# Patient Record
Sex: Female | Born: 1980
Health system: Southern US, Community
[De-identification: ages and names within clinical notes are randomized; demographics above are authoritative.]

## PROBLEM LIST (undated history)

## (undated) HISTORY — PX: ANKLE SURGERY: SHX546

## (undated) HISTORY — PX: RHINOPLASTY: SUR1284

## (undated) HISTORY — PX: HEMORRHOID SURGERY: SHX153

---

## 2001-12-11 ENCOUNTER — Other Ambulatory Visit: Admission: RE | Admit: 2001-12-11 | Discharge: 2001-12-11 | Payer: Self-pay

## 2002-01-21 DIAGNOSIS — D229 Melanocytic nevi, unspecified: Secondary | ICD-10-CM

## 2002-01-21 HISTORY — DX: Melanocytic nevi, unspecified: D22.9

## 2003-01-27 ENCOUNTER — Ambulatory Visit (HOSPITAL_BASED_OUTPATIENT_CLINIC_OR_DEPARTMENT_OTHER): Admission: RE | Admit: 2003-01-27 | Discharge: 2003-01-27 | Payer: Self-pay | Admitting: Otolaryngology

## 2009-05-05 ENCOUNTER — Encounter: Payer: Self-pay | Admitting: Internal Medicine

## 2009-05-05 ENCOUNTER — Ambulatory Visit: Payer: Self-pay | Admitting: Gastroenterology

## 2009-05-05 DIAGNOSIS — R197 Diarrhea, unspecified: Secondary | ICD-10-CM | POA: Insufficient documentation

## 2009-05-05 DIAGNOSIS — R634 Abnormal weight loss: Secondary | ICD-10-CM

## 2009-05-05 DIAGNOSIS — K219 Gastro-esophageal reflux disease without esophagitis: Secondary | ICD-10-CM

## 2009-05-05 DIAGNOSIS — K921 Melena: Secondary | ICD-10-CM

## 2009-05-07 ENCOUNTER — Encounter: Payer: Self-pay | Admitting: Internal Medicine

## 2009-05-12 HISTORY — PX: COLONOSCOPY: SHX174

## 2009-05-14 ENCOUNTER — Ambulatory Visit: Payer: Self-pay | Admitting: Gastroenterology

## 2009-05-14 ENCOUNTER — Ambulatory Visit (HOSPITAL_COMMUNITY): Admission: RE | Admit: 2009-05-14 | Discharge: 2009-05-14 | Payer: Self-pay | Admitting: Gastroenterology

## 2009-10-14 ENCOUNTER — Encounter: Admission: RE | Admit: 2009-10-14 | Discharge: 2009-10-14 | Payer: Self-pay | Admitting: Internal Medicine

## 2010-04-13 NOTE — Letter (Signed)
Summary: [facility]  OFFICE NOTE/[facility]   Imported By: Diana Eves 05/07/2009 16:07:07  _____________________________________________________________________  External Attachment:    Type:   Image     Comment:   External Document

## 2010-04-13 NOTE — Assessment & Plan Note (Signed)
Summary: NPP.DIARRHEA./GLU   Visit Type:  Consult Referring Provider:  Sherril Croon Primary Care Provider:  Vyas  Chief Complaint:  diarrhea.  History of Present Illness: Ms. Beverly Brown is a pleasant 30 year old lady who presents for further evaluation of chronic diarrhea. She developed diarrhea about 5-6 months ago. She states every time she eats she has loose stool. If she doesn't eat, her stool frequency improves. She denies having solid stool at this point. She's had several episodes of incontinence. She has bright red blood noted on the toilet tissue. She complains of lower abdominal pain. In the last one month she lost 10-15 pounds which is documented. Her appetite was diminished on Levbid so she stopped the medication.  She was given Lomotil but was afraid to take it. She's been off Accutane now for about 6 months. She took it for 6-8 months. As a child she recalls having severe constipation and bleeding. She saw a gastroenterologist but she does not recall the details. She desires to have a colonoscopy because of her symptoms and concern for inflammatory bowel disease. She also complains of short-lived left chest pain which is severe at times. It last seconds to minutes. She thought it might be reflux. It happens about once or twice per week. Denies any associated diaphoresis or shortness of breath.    Current Medications (verified): 1)  None  Allergies (verified): No Known Drug Allergies  Past History:  Past Medical History: Acne, completed Accutane 6 months ago.  Past Surgical History: Rhinoplasty, therapeutic Hemorrhoids  Family History: Father, bowel issues, deceased, does not know details. No FH CRC.  Social History: Married. No children. Full-time student trying to get RN. 1ppd cig for 14 years. No alcohol, drugs.  Review of Systems General:  Complains of weight loss; denies fever, chills, sweats, anorexia, fatigue, and weakness. Eyes:  Denies vision loss. ENT:  Denies  nasal congestion, sore throat, hoarseness, and difficulty swallowing. CV:  Denies chest pains, angina, palpitations, dyspnea on exertion, and peripheral edema. Resp:  Denies dyspnea at rest, dyspnea with exercise, cough, sputum, and wheezing. GI:  See HPI. GU:  Denies urinary burning and blood in urine. MS:  Denies joint pain / LOM. Derm:  Denies rash and itching. Neuro:  Denies weakness, frequent headaches, memory loss, and confusion. Psych:  Denies depression and anxiety. Endo:  Complains of unusual weight change. Heme:  Denies bruising and bleeding. Allergy:  Denies hives and rash.  Vital Signs:  Patient profile:   30 year old female Height:      65 inches Weight:      121 pounds BMI:     20.21 Temp:     84 degrees F oral Pulse rate:   84 / minute BP sitting:   120 / 84  (left arm) Cuff size:   regular  Vitals Entered By: Hendricks Limes LPN (May 05, 2009 8:55 AM)  Physical Exam  General:  Well developed, well nourished, no acute distress. Head:  Normocephalic and atraumatic. Eyes:  Conjunctivae pink, no scleral icterus.  Mouth:  Oropharyngeal mucosa moist, pink.  No lesions, erythema or exudate.    Neck:  Supple; no masses or thyromegaly. Lungs:  Clear throughout to auscultation. Heart:  Regular rate and rhythm; no murmurs, rubs,  or bruits. Abdomen:  Soft. Flat. Positive BS. No HSM or masses. No abd bruit or hernia. Mild RLQ tenderness to deep palpation. No rebound or guarding. Rectal:  deferred until time of colonoscopy.   Extremities:  No clubbing, cyanosis, edema or  deformities noted. Neurologic:  Alert and  oriented x4;  grossly normal neurologically. Skin:  Intact without significant lesions or rashes. Cervical Nodes:  No significant cervical adenopathy. Psych:  Alert and cooperative. Normal mood and affect.  Impression & Recommendations:  Problem # 1:  DIARRHEA, CHRONIC (ICD-787.91)  Chronic diarrhea, brbpr, wt loss and lower abd pain. More RLQ pain on  exam. Previous Accutane use. Need to r/o IBD. Colonoscopy/termianl ileoscopy to be performed in near future.  Risks, alternatives, and benefits including but not limited to the risk of reaction to medication, bleeding, infection, and perforation were addressed.  Patient voiced understanding and provided verbal consent.   Orders: Consultation Level III (59563)  Problem # 2:  GERD (ICD-530.81)  Intermittent sharp chest pain, fleeting. No SOB or diaphoresis. Trial of PPI for four weeks. Samples of Prevacid 24 hr to take one by mouth daily. If symptoms persists, would recommend she f/u with PCP to r/o other possible etiologies.  Orders: Consultation Level III [phone]) I would like to thank Dr. Sherril Croon for allowing [place] to take part in the care of this nice patient.  Appended Document: NPP.DIARRHEA./GLU Labs from 1/11, Dr. Sherril Croon, showed normal LFTS, CBC, TSH.

## 2010-04-13 NOTE — Letter (Signed)
Summary: [facility]  [facility]   Imported By: Diana Eves 05/14/2009 10:51:01  _____________________________________________________________________  External Attachment:    Type:   Image     Comment:   External Document

## 2010-04-13 NOTE — Letter (Signed)
Summary: [facility] ORDER  [facility] ORDER   Imported By: Ave Filter 05/05/2009 09:38:16  _____________________________________________________________________  External Attachment:    Type:   Image     Comment:   External Document

## 2010-07-30 NOTE — Op Note (Signed)
NAMEMAISEE, VOLLMAN NO.:  [id number]   MEDICAL RECORD NO.:  [id number]                   PATIENT TYPE:  AMB   LOCATION:  [facility]                                  FACILITY:  [facility]   PHYSICIAN:  Karol T. Lazarus Salines, M.D.              DATE OF BIRTH:  04-Sep-1980   DATE OF PROCEDURE:  01/27/2003  DATE OF DISCHARGE:                                 OPERATIVE REPORT   PREOPERATIVE DIAGNOSIS:  Hypertrophic inferior turbinates with obstruction.   POSTOPERATIVE DIAGNOSIS:  Hypertrophic inferior turbinates with obstruction.   PROCEDURE PERFORMED:  Bilateral SMR inferior turbinates.   SURGEON:  Gloris Manchester. Lazarus Salines, M.D.   ANESTHESIA:  General orotracheal.   BLOOD LOSS:  Minimal.   COMPLICATIONS:  None.   FINDINGS:  Bulky bony inferior turbinates, bilateral, with inflammation of  the anterior poles in the nasal vestibule.  Mild anterior leftward septal  deviation, status post septoplasty with Dr. __________ of last year.   PROCEDURE:  With the patient in the comfortable supine position, general  orotracheal anesthesia was induced without difficulty.  At an appropriate  level, the patient was placed in a slight sitting position.  A saline-  moistened throat pack was placed.  The inferior turbinates were infiltrated  with 6 mL of 1% Xylocaine with 1:100,000 epinephrine using a 25-gauge spinal  needle.  Finally, 4% cocaine solution, 160 mg total, was applied on 0.5 x 3  inch cottonoids to both sides of the nasal septum for intraoperative  hemostasis.  Several minutes were allowed for all of this to take effect.  A  sterile preparation and draping of the midface was accomplished.   The materials were removed from the nose and throat, to be intact and  correct in number.  The findings were as described above.  Beginning on the  right side, the anterior foot of the inferior turbinate was lysed just  behind the nasal valve.  The medial mucosa was incised in an  anterior  upsloping fashion, and a laterally based flap was developed.  The turbinate  was in-fractured.  Using angled turbinate scissors, the turbinate bone and  lateral mucosa were incised in a posterior downsloping fashion, taking  virtually all of the anterior pole and leaving all of the posterior pole.  Bony spicules were carefully removed to allow the mucosa to approximate.  The bulbous posterior pole and the cut mucosal edges were suction  coagulated.  The laterally based flap was lain down.  The turbinate was out-  fractured.  This side was completed.  The left side was done in an identical  fashion.  Following completion of both resections, a triple thickness  Neosporin-impregnated Telfa pack was placed on each side of the nose for  postoperative hemostasis.  At this point, the procedure was completed.  Hemostasis was observed.  The pharynx was suctioned free, and the throat  pack was removed.  The  patient was returned to anesthesia, awakened,  extubated, and transferred to recovery in stable condition.   COMMENTS:  30 year old white female, status post nasal trauma with a  subsequent septoplasty and persistent complaints of nasal obstruction with  hypertrophic inferior turbinates was the indication for today's procedure.  Anticipate a routine postoperative recovery with attention to ice,  elevation, analgesia, antibiosis.  We will remove the packs in one day and  then institute nasal hygiene measures.  We will limit her activities for ten  days.  She was given a low anticipated risk of postanesthetic or  postsurgical complications, feel an outpatient venue is appropriate.                                               Gloris Manchester. Lazarus Salines, M.D.    KTW/MEDQ  D:  01/27/2003  T:  01/27/2003  Job:  [id number]

## 2011-05-27 ENCOUNTER — Other Ambulatory Visit: Payer: Self-pay | Admitting: Dermatology

## 2011-10-26 IMAGING — CT CT ABD-PELV W/ CM
2 of 4 series · 10 of 36 positions shown, 17 images · IV contrast (READICAT/WATER & [ID] OMNI 300)
Comparison: None.

CLINICAL DATA: Unspecified low abdominal pressure for 1 month.  No
history of malignancy or prior relevant surgery.

CT ABDOMEN AND PELVIS WITH CONTRAST
TECHNIQUE: Multidetector CT imaging of the abdomen and pelvis was
performed following the standard protocol during bolus
administration of intravenous contrast.
Contrast: 100 ml Tmnipaque-J77 intravenously.

[Series 3: routine abdomen · axial · 0.63mm/px · z∈[-355,-35]mm · 9 of 80 slices shown, 15 images]
[im 8/80  soft-tissue]
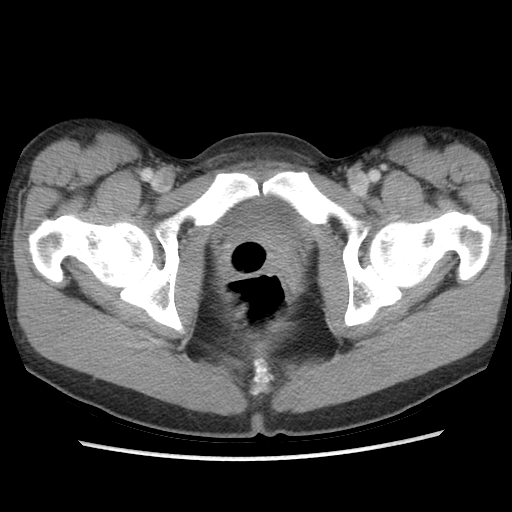
[im 8/80  bone]
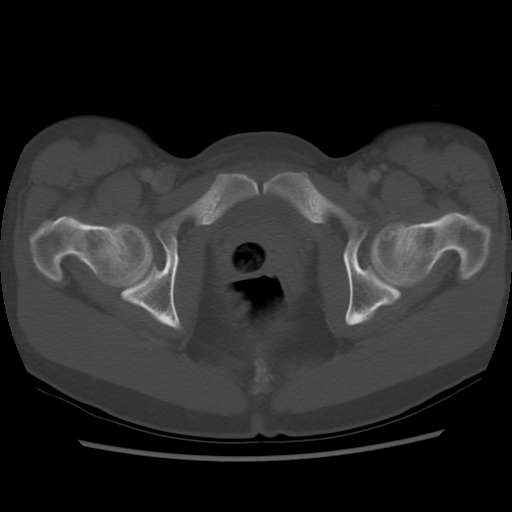
[im 16/80  soft-tissue]
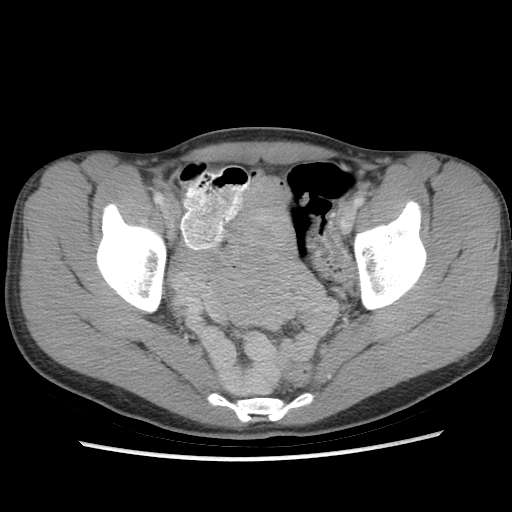
[im 24/80  soft-tissue]
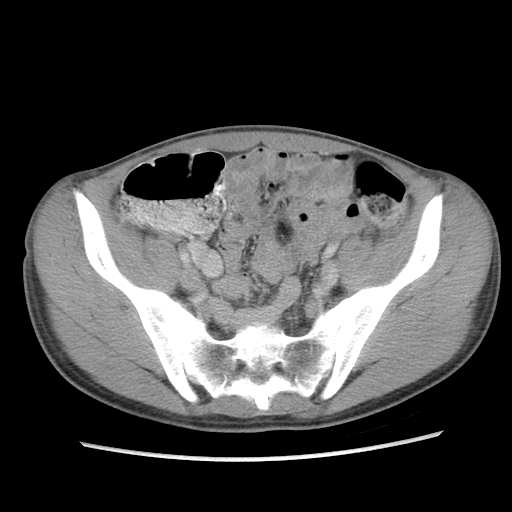
[im 32/80  soft-tissue]
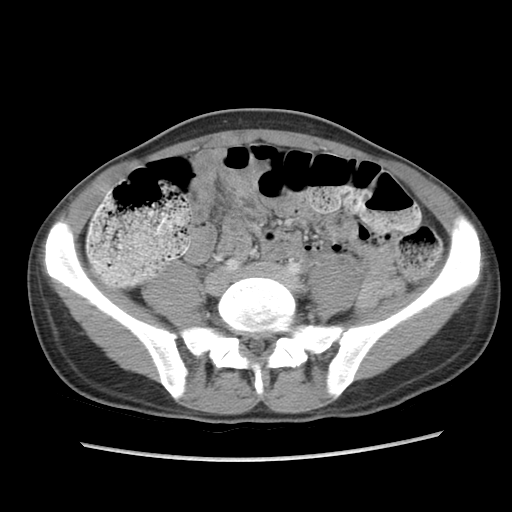
[im 40/80  soft-tissue]
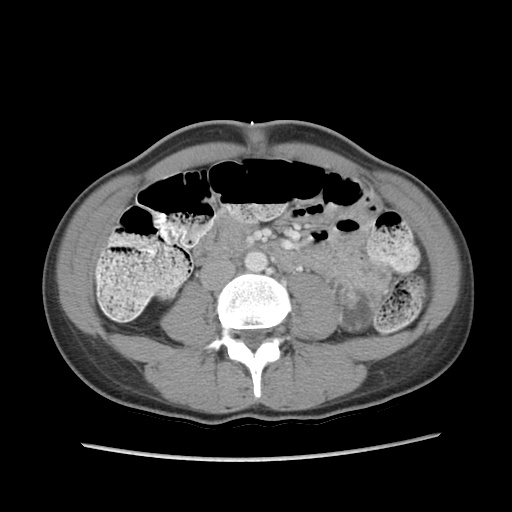
[im 48/80  soft-tissue]
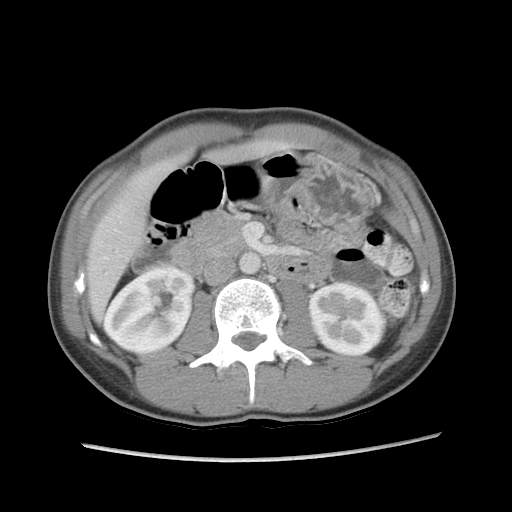
[im 48/80  lung]
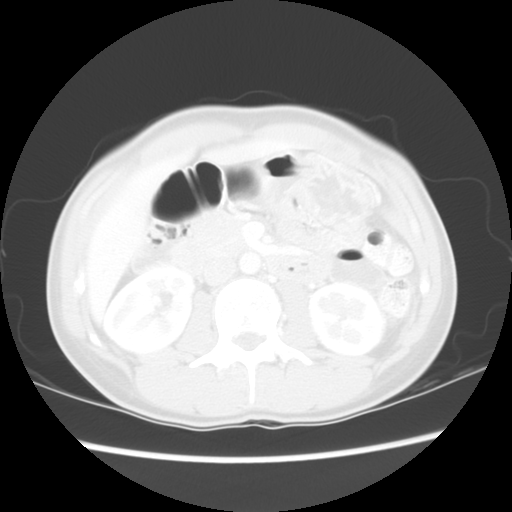
[im 56/80  soft-tissue]
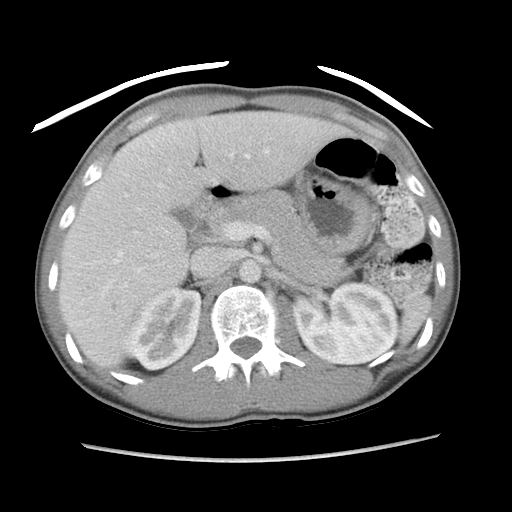
[im 56/80  lung]
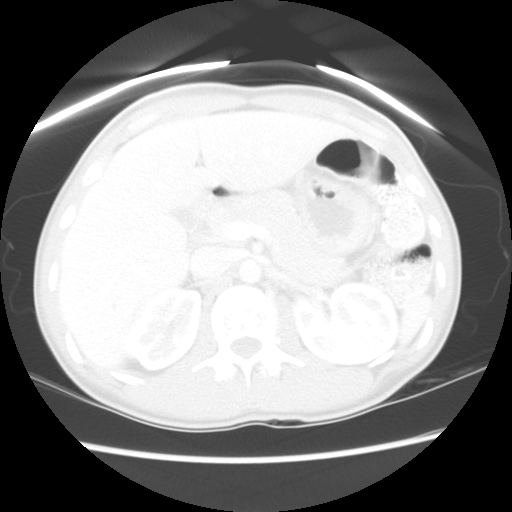
[im 64/80  soft-tissue]
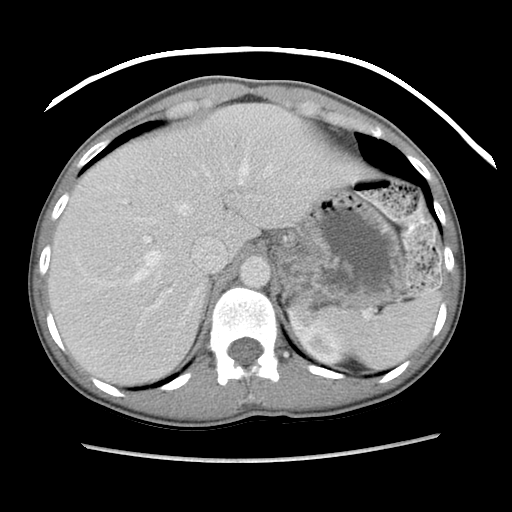
[im 64/80  lung]
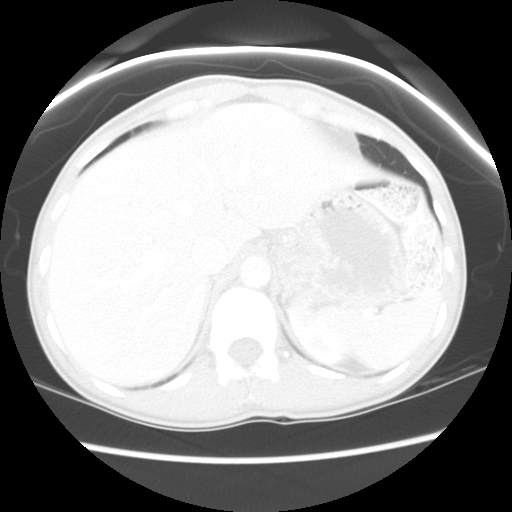
[im 72/80  soft-tissue]
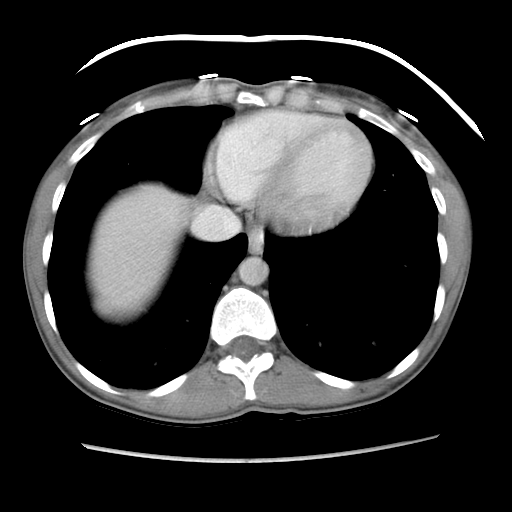
[im 72/80  lung]
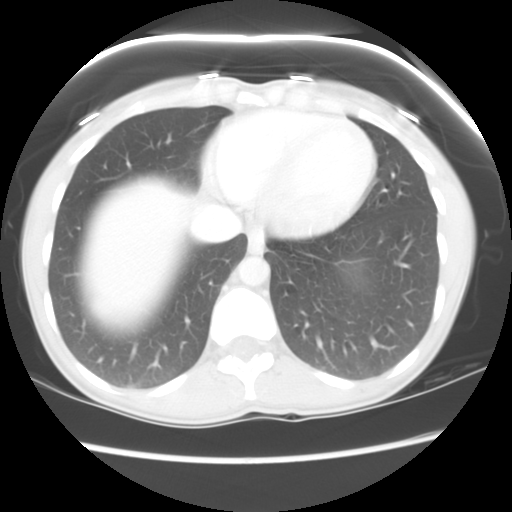
[im 72/80  bone]
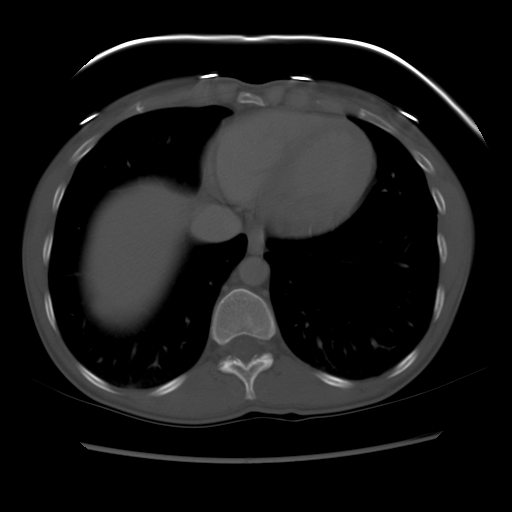

[Series 601: coronal body · coronal · 0.85mm/px · 1 of 105 slices shown, 2 images]
[im 35/105  soft-tissue]
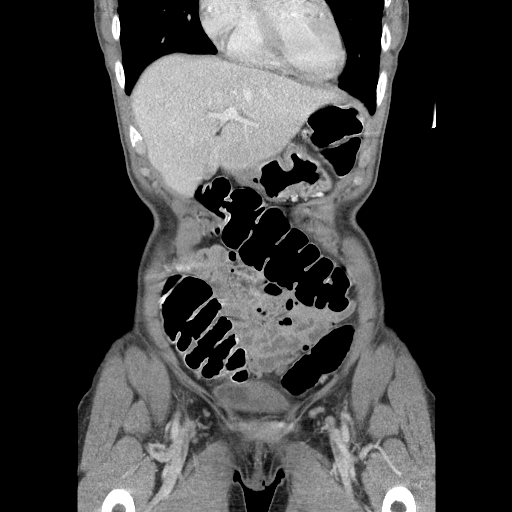
[im 35/105  bone]
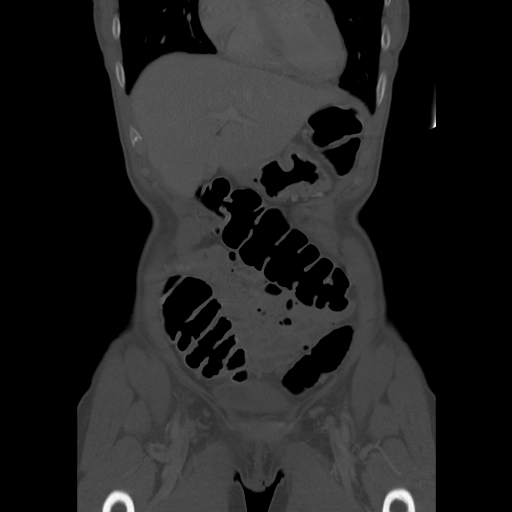

[10 of 36 positions shown; findings below may reference images not displayed]

FINDINGS: The lung bases are clear and there is no pleural
effusion.  Low density in the liver adjacent the falciform ligament
is typical for focal fat.  The liver otherwise appears normal.  The
gallbladder, spleen, pancreas, adrenal glands and kidneys appear
normal.

Moderate stool is noted throughout the colon.  No bowel wall
thickening or inflammatory change is identified.  The appendix
appears normal.  2.1 cm low density structure is noted superior to
the bladder on the left, probably a left ovarian follicle.  No
right adnexal abnormalities identified.  The uterus appears normal.
Vaginal tampon is noted.  There are bilateral pelvic phleboliths.
IMPRESSION: 1.  No demonstrated acute abdominal pelvic findings.
2.  Probable left ovarian follicle.
3.  Prominent stool throughout the colon suggesting constipation.

## 2012-07-17 ENCOUNTER — Ambulatory Visit (HOSPITAL_COMMUNITY)
Admission: RE | Admit: 2012-07-17 | Discharge: 2012-07-17 | Disposition: A | Discharge status: Home / Self Care | Payer: Medicaid Other | Source: Ambulatory Visit | Attending: Specialist | Admitting: Specialist

## 2012-07-17 ENCOUNTER — Encounter (HOSPITAL_COMMUNITY): Payer: Self-pay

## 2012-07-17 VITALS — BP 131/77 | HR 89 | Wt 155.0 lb

## 2012-07-17 DIAGNOSIS — N289 Disorder of kidney and ureter, unspecified: Secondary | ICD-10-CM | POA: Insufficient documentation

## 2012-07-17 DIAGNOSIS — D649 Anemia, unspecified: Secondary | ICD-10-CM

## 2012-07-17 NOTE — Progress Notes (Signed)
MATERNAL FETAL MEDICINE CONSULT  Patient Name: Beverly Brown Medical Record Number:  [id number] Date of Birth: 20-Mar-1980 Requesting Physician Name:  Eliseo Squires, MD Date of Service: 07/17/2012  Chief Complaint Renal insufficiency, proteinuria, and anemia  History of Present Illness Beverly Brown was seen today for prenatal diagnosis secondary to renal insufficiency, proteinuria, and anemia at the request of Eliseo Squires, MD.  The patient is a 32 y.o. [id number],JY [redacted]w[redacted]d with an EDD of 08/11/2012, by Ultrasound dating method.  Her pregnancy was uncomplicated until approximately 30 weeks of gestation when she was noted to have 3+ proteinuria on a routine OB visit.  A 24 hour urine protein collection was performed at 33 weeks and demonstrated 2205 mg of protein.  Her most recent 24 hour urine collection (completed yesterday) showed 4224 mg of protein and a creatinine clearance of 95, with a serum creatinine of 0.95.  She was also noted to have a hemoglobin of 7.7 at approximately 33 weeks.  Yesterday her hemoglobin was 6.8.  She has been compliant with oral iron replacement.  Other than fatigue she reports no symptoms at this time.  Review of Systems Pertinent items are noted in HPI.  Patient History OB History   Grav Para Term Preterm Abortions TAB SAB Ect Mult Living   4 1 1  0 2 0 2 0 0 1     # Outc Date GA Lbr Len/2nd Wgt Sex Del Anes PTL Lv   1 TRM            2 SAB            3 SAB            4 CUR               History reviewed. No pertinent past medical history.  History reviewed. No pertinent past surgical history.  History   Social History  . Marital Status: Married    Spouse Name: N/A    Number of Children: N/A  . Years of Education: N/A   Social History Main Topics  . Smoking status: None  . Smokeless tobacco: None  . Alcohol Use: None  . Drug Use: None  . Sexually Active: None   Other Topics Concern  . None   Social History Narrative  . None    No family  history on file. In addition, the patient has no family history of mental retardation, birth defects, or genetic diseases.  Physical Examination Filed Vitals:   07/17/12 0912  BP: 131/77  Pulse: 89   General appearance - alert, well appearing, and in no distress  Assessment and Recommendations 1.  Renal insufficiency with proteinuria.  Beverly Brown has been normotensive throughout pregnancy save for two isolated blood pressures with systolic pressures of greater than 140 on two prenatal visits.  They have been in the normal range for many weeks now.  Given that she is normotensive and has severe anemia, I suspect a primary renal cause rather than a hypertensive disorder of pregnancy.  She will require a complete work-up, including assessment of glomerular disease and systemic lupus nephritis.  However, as she is already 36 weeks, and she is stable from a renal filtration standpoint, the workup can be deferred until 6 weeks postpartum.  This evaluation would best be performed by a nehprologist.  However we are happy to assist if desired.  I recommend an induction of labor at 37 weeks given the increase risk of stillbirth in women with  renal disease.  Beverly Brown reports she had a reassuring NST earlier this week and has another one schedule for later this week.  Twice weekly fetal testing should be continued if the decision is made to prolong pregnancy past 37 weeks. 2.  Severe Anemia.  Beverly Brown most recent hemoglobin was very low at 6.8.  Given this low starting point she is at risk of life threatening anemia with even a usual amount of postpartum blood loss.  This needs to be addressed prior to delivery.  Unfortunately, oral iron replacement has not been effective in treating the anemia.  IV iron would be a reasonable option; however, one week of treatment is unlikely to result in a sufficient increase in hemoglobin.  If a sufficient rise is not seen I recommend proceeding with a red blood cell  transfusion prior to induction.  Beverly Fendt, MD

## 2012-07-17 NOTE — Addendum Note (Signed)
Encounter addended by: Rema Fendt, MD on: 07/17/2012 10:42 AM<BR>     Documentation filed: Notes Section

## 2012-07-17 NOTE — Progress Notes (Signed)
.  [email or web address]

## 2012-07-17 NOTE — Progress Notes (Signed)
I spent 30 minutes with Beverly Brown today of which 50% was face-to-face counseling.

## 2012-07-17 NOTE — Addendum Note (Signed)
Encounter addended by: Rema Fendt, MD on: 07/17/2012 10:43 AM<BR>     Documentation filed: Notes Section

## 2012-07-18 ENCOUNTER — Other Ambulatory Visit: Payer: Self-pay

## 2013-05-16 ENCOUNTER — Other Ambulatory Visit: Payer: Self-pay | Admitting: Dermatology

## 2013-05-22 ENCOUNTER — Encounter (HOSPITAL_COMMUNITY): Payer: Self-pay | Admitting: *Deleted

## 2014-01-13 ENCOUNTER — Encounter (HOSPITAL_COMMUNITY): Payer: Self-pay | Admitting: *Deleted

## 2014-07-08 ENCOUNTER — Encounter: Payer: Self-pay | Admitting: Gastroenterology

## 2014-07-30 ENCOUNTER — Encounter: Payer: Self-pay | Admitting: Gastroenterology

## 2014-07-30 ENCOUNTER — Ambulatory Visit: Payer: Medicaid Other | Admitting: Gastroenterology

## 2014-07-30 ENCOUNTER — Telehealth: Payer: Self-pay | Admitting: Gastroenterology

## 2014-07-30 NOTE — Telephone Encounter (Signed)
PATIENT WAS A NO SHOW   SHE REASCHEULED

## 2014-08-04 ENCOUNTER — Encounter: Payer: Self-pay | Admitting: Gastroenterology

## 2014-08-04 ENCOUNTER — Ambulatory Visit (INDEPENDENT_AMBULATORY_CARE_PROVIDER_SITE_OTHER): Payer: BLUE CROSS/BLUE SHIELD | Admitting: Gastroenterology

## 2014-08-04 ENCOUNTER — Encounter (INDEPENDENT_AMBULATORY_CARE_PROVIDER_SITE_OTHER): Payer: Self-pay

## 2014-08-04 VITALS — BP 132/69 | HR 95 | Temp 98.3°F | Ht 65.0 in | Wt 124.8 lb

## 2014-08-04 DIAGNOSIS — R1013 Epigastric pain: Secondary | ICD-10-CM | POA: Diagnosis not present

## 2014-08-04 DIAGNOSIS — R197 Diarrhea, unspecified: Secondary | ICD-10-CM

## 2014-08-04 DIAGNOSIS — D509 Iron deficiency anemia, unspecified: Secondary | ICD-10-CM

## 2014-08-04 DIAGNOSIS — R1030 Lower abdominal pain, unspecified: Secondary | ICD-10-CM | POA: Diagnosis not present

## 2014-08-04 NOTE — Patient Instructions (Signed)
1. I have requested records from Dr. Britta Mccreedy and [facility] health for further review. Once reviewed, we will make further recommendations.

## 2014-08-04 NOTE — Assessment & Plan Note (Signed)
Ongoing poor appetite, postprandial epigastric pain. Denies any typical heartburn. Suspect nonulcerative dyspepsia although cannot rule out peptic ulcer disease. Awaiting records from Dr. Britta Mccreedy. Further recommendations to follow.
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
## 2014-08-04 NOTE — Assessment & Plan Note (Signed)
Patient gives history of iron deficiency anemia dating back about 4 years. She has had 2 pregnancies since that time. No menstrual losses since December 2015. Chronic iron therapy since 2012. Requested records from women's health regarding last labs within the past one year. Current labs indicate low iron saturations but ferritin and hemoglobin are normal. Further recommendations to follow.
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
## 2014-08-05 NOTE — Progress Notes (Signed)
cc'ed to pcp °

## 2014-10-09 ENCOUNTER — Telehealth: Payer: Self-pay | Admitting: Gastroenterology

## 2014-10-09 DIAGNOSIS — R197 Diarrhea, unspecified: Secondary | ICD-10-CM

## 2014-10-09 NOTE — Telephone Encounter (Signed)
Tried to call with no answer  

## 2014-10-09 NOTE — Telephone Encounter (Signed)
Please let patient know that we never received any significant records from Dr. Britta Mccreedy or her GYN.  Offer patient to have the following labs -TTG, IgA -IgA level  Offer patient follow up OV.

## 2014-10-13 ENCOUNTER — Other Ambulatory Visit: Payer: Self-pay

## 2014-10-13 DIAGNOSIS — R197 Diarrhea, unspecified: Secondary | ICD-10-CM

## 2014-10-13 NOTE — Telephone Encounter (Signed)
Mailed letter and labs to pt.  This is an [facility] pt.

## 2016-08-02 DIAGNOSIS — Z0000 Encounter for general adult medical examination without abnormal findings: Secondary | ICD-10-CM | POA: Diagnosis not present

## 2016-08-02 DIAGNOSIS — Z1389 Encounter for screening for other disorder: Secondary | ICD-10-CM | POA: Diagnosis not present

## 2016-08-02 DIAGNOSIS — Z720 Tobacco use: Secondary | ICD-10-CM | POA: Diagnosis not present

## 2016-08-02 DIAGNOSIS — L209 Atopic dermatitis, unspecified: Secondary | ICD-10-CM | POA: Diagnosis not present

## 2016-08-02 DIAGNOSIS — Z6822 Body mass index (BMI) 22.0-22.9, adult: Secondary | ICD-10-CM | POA: Diagnosis not present

## 2016-11-23 DIAGNOSIS — Z6821 Body mass index (BMI) 21.0-21.9, adult: Secondary | ICD-10-CM | POA: Diagnosis not present

## 2016-11-23 DIAGNOSIS — N898 Other specified noninflammatory disorders of vagina: Secondary | ICD-10-CM | POA: Diagnosis not present

## 2016-11-23 DIAGNOSIS — Z01411 Encounter for gynecological examination (general) (routine) with abnormal findings: Secondary | ICD-10-CM | POA: Diagnosis not present

## 2016-11-23 DIAGNOSIS — B001 Herpesviral vesicular dermatitis: Secondary | ICD-10-CM | POA: Diagnosis not present

## 2016-12-02 DIAGNOSIS — Z6821 Body mass index (BMI) 21.0-21.9, adult: Secondary | ICD-10-CM | POA: Diagnosis not present

## 2016-12-02 DIAGNOSIS — J0101 Acute recurrent maxillary sinusitis: Secondary | ICD-10-CM | POA: Diagnosis not present

## 2016-12-02 DIAGNOSIS — R350 Frequency of micturition: Secondary | ICD-10-CM | POA: Diagnosis not present

## 2016-12-13 DIAGNOSIS — X501XXA Overexertion from prolonged static or awkward postures, initial encounter: Secondary | ICD-10-CM | POA: Diagnosis not present

## 2016-12-13 DIAGNOSIS — Y9351 Activity, roller skating (inline) and skateboarding: Secondary | ICD-10-CM | POA: Diagnosis not present

## 2016-12-13 DIAGNOSIS — F17200 Nicotine dependence, unspecified, uncomplicated: Secondary | ICD-10-CM | POA: Diagnosis not present

## 2016-12-13 DIAGNOSIS — M25571 Pain in right ankle and joints of right foot: Secondary | ICD-10-CM | POA: Diagnosis not present

## 2016-12-13 DIAGNOSIS — S82891A Other fracture of right lower leg, initial encounter for closed fracture: Secondary | ICD-10-CM | POA: Diagnosis not present

## 2016-12-13 DIAGNOSIS — S8261XA Displaced fracture of lateral malleolus of right fibula, initial encounter for closed fracture: Secondary | ICD-10-CM | POA: Diagnosis not present

## 2016-12-13 DIAGNOSIS — Y92331 Roller skating rink as the place of occurrence of the external cause: Secondary | ICD-10-CM | POA: Diagnosis not present

## 2016-12-15 DIAGNOSIS — Z6821 Body mass index (BMI) 21.0-21.9, adult: Secondary | ICD-10-CM | POA: Diagnosis not present

## 2016-12-15 DIAGNOSIS — S82851A Displaced trimalleolar fracture of right lower leg, initial encounter for closed fracture: Secondary | ICD-10-CM | POA: Diagnosis not present

## 2016-12-19 DIAGNOSIS — Y92838 Other recreation area as the place of occurrence of the external cause: Secondary | ICD-10-CM | POA: Diagnosis not present

## 2016-12-19 DIAGNOSIS — S82851A Displaced trimalleolar fracture of right lower leg, initial encounter for closed fracture: Secondary | ICD-10-CM | POA: Diagnosis not present

## 2016-12-19 DIAGNOSIS — Y9351 Activity, roller skating (inline) and skateboarding: Secondary | ICD-10-CM | POA: Diagnosis not present

## 2016-12-19 DIAGNOSIS — F17210 Nicotine dependence, cigarettes, uncomplicated: Secondary | ICD-10-CM | POA: Diagnosis not present

## 2016-12-19 DIAGNOSIS — W1839XA Other fall on same level, initial encounter: Secondary | ICD-10-CM | POA: Diagnosis not present

## 2016-12-20 DIAGNOSIS — Y9351 Activity, roller skating (inline) and skateboarding: Secondary | ICD-10-CM | POA: Diagnosis not present

## 2016-12-20 DIAGNOSIS — W1839XA Other fall on same level, initial encounter: Secondary | ICD-10-CM | POA: Diagnosis not present

## 2016-12-20 DIAGNOSIS — S82832A Other fracture of upper and lower end of left fibula, initial encounter for closed fracture: Secondary | ICD-10-CM | POA: Diagnosis not present

## 2016-12-20 DIAGNOSIS — Y92838 Other recreation area as the place of occurrence of the external cause: Secondary | ICD-10-CM | POA: Diagnosis not present

## 2016-12-20 DIAGNOSIS — S82851A Displaced trimalleolar fracture of right lower leg, initial encounter for closed fracture: Secondary | ICD-10-CM | POA: Diagnosis not present

## 2016-12-20 DIAGNOSIS — F17210 Nicotine dependence, cigarettes, uncomplicated: Secondary | ICD-10-CM | POA: Diagnosis not present

## 2017-01-03 DIAGNOSIS — S82851D Displaced trimalleolar fracture of right lower leg, subsequent encounter for closed fracture with routine healing: Secondary | ICD-10-CM | POA: Diagnosis not present

## 2017-01-03 DIAGNOSIS — S82851A Displaced trimalleolar fracture of right lower leg, initial encounter for closed fracture: Secondary | ICD-10-CM | POA: Diagnosis not present

## 2017-02-01 DIAGNOSIS — S82851D Displaced trimalleolar fracture of right lower leg, subsequent encounter for closed fracture with routine healing: Secondary | ICD-10-CM | POA: Diagnosis not present

## 2017-03-30 DIAGNOSIS — Z6821 Body mass index (BMI) 21.0-21.9, adult: Secondary | ICD-10-CM | POA: Diagnosis not present

## 2017-03-30 DIAGNOSIS — S82851D Displaced trimalleolar fracture of right lower leg, subsequent encounter for closed fracture with routine healing: Secondary | ICD-10-CM | POA: Diagnosis not present

## 2017-05-16 DIAGNOSIS — R05 Cough: Secondary | ICD-10-CM | POA: Diagnosis not present

## 2017-05-16 DIAGNOSIS — R634 Abnormal weight loss: Secondary | ICD-10-CM | POA: Diagnosis not present

## 2017-05-16 DIAGNOSIS — J301 Allergic rhinitis due to pollen: Secondary | ICD-10-CM | POA: Diagnosis not present

## 2017-05-16 DIAGNOSIS — R5383 Other fatigue: Secondary | ICD-10-CM | POA: Diagnosis not present

## 2017-05-16 DIAGNOSIS — Z6820 Body mass index (BMI) 20.0-20.9, adult: Secondary | ICD-10-CM | POA: Diagnosis not present

## 2017-05-19 DIAGNOSIS — D72829 Elevated white blood cell count, unspecified: Secondary | ICD-10-CM | POA: Diagnosis not present

## 2017-05-19 DIAGNOSIS — D696 Thrombocytopenia, unspecified: Secondary | ICD-10-CM | POA: Diagnosis not present

## 2017-05-30 DIAGNOSIS — N898 Other specified noninflammatory disorders of vagina: Secondary | ICD-10-CM | POA: Diagnosis not present

## 2017-05-30 DIAGNOSIS — R350 Frequency of micturition: Secondary | ICD-10-CM | POA: Diagnosis not present

## 2017-05-30 DIAGNOSIS — R102 Pelvic and perineal pain: Secondary | ICD-10-CM | POA: Diagnosis not present

## 2017-05-30 DIAGNOSIS — Z113 Encounter for screening for infections with a predominantly sexual mode of transmission: Secondary | ICD-10-CM | POA: Diagnosis not present

## 2017-06-14 DIAGNOSIS — R634 Abnormal weight loss: Secondary | ICD-10-CM | POA: Diagnosis not present

## 2017-06-14 DIAGNOSIS — J301 Allergic rhinitis due to pollen: Secondary | ICD-10-CM | POA: Diagnosis not present

## 2017-06-14 DIAGNOSIS — R05 Cough: Secondary | ICD-10-CM | POA: Diagnosis not present

## 2017-06-14 DIAGNOSIS — D72829 Elevated white blood cell count, unspecified: Secondary | ICD-10-CM | POA: Diagnosis not present

## 2017-06-14 DIAGNOSIS — R5383 Other fatigue: Secondary | ICD-10-CM | POA: Diagnosis not present

## 2017-06-29 ENCOUNTER — Encounter: Payer: Self-pay | Admitting: Allergy & Immunology

## 2017-06-29 ENCOUNTER — Ambulatory Visit: Payer: BLUE CROSS/BLUE SHIELD | Admitting: Allergy & Immunology

## 2017-06-29 VITALS — BP 120/66 | HR 108 | Temp 98.5°F | Resp 16 | Ht 63.0 in | Wt 125.6 lb

## 2017-06-29 DIAGNOSIS — T7800XD Anaphylactic reaction due to unspecified food, subsequent encounter: Secondary | ICD-10-CM | POA: Diagnosis not present

## 2017-06-29 DIAGNOSIS — J310 Chronic rhinitis: Secondary | ICD-10-CM

## 2017-06-29 DIAGNOSIS — R05 Cough: Secondary | ICD-10-CM | POA: Diagnosis not present

## 2017-06-29 DIAGNOSIS — R059 Cough, unspecified: Secondary | ICD-10-CM

## 2017-06-29 MED ORDER — EPINEPHRINE 0.3 MG/0.3ML IJ SOAJ: INTRAMUSCULAR | 1 refills | Status: DC

## 2017-06-29 MED ORDER — OMEPRAZOLE 20 MG PO CPDR: DELAYED_RELEASE_CAPSULE | ORAL | 1 refills | Status: DC

## 2017-06-29 MED ORDER — BEPOTASTINE BESILATE 1.5 % OP SOLN: 1.0000 [drp] | Freq: Two times a day (BID) | OPHTHALMIC | 1 refills | Status: DC

## 2017-06-29 MED ORDER — CRISABOROLE 2 % EX OINT: 1.0000 "application " | TOPICAL_OINTMENT | Freq: Two times a day (BID) | CUTANEOUS | 1 refills | Status: DC

## 2017-06-29 NOTE — Patient Instructions (Addendum)
1. Cough - Lung testing looked great today. - We will address postnasal drip before trying to treat any asthma.  - We will start omeprazole 20mg  once daily to see if this might help your cough.  - Reflux is a known cause of cough, and with your chest pain this is something to consider.   2. Chronic rhinitis - Testing was negative to prick and intradermals. - We will get environmental allergy testing via blood work to confirm this. - Start Nasacort two sprays per nostril daily. - Start Bepreve one drop per eye twice daily. - Start Eucrisa twice daily as needed to the eye lid and around your eye (safe to use on face)  3. Anaphylactic shock due to food - Testing was negative to Soy, Wheat, Milk and Casein - Avoid the above foods for now.  - We will get blood work to confirm these findings.  - Training for epinephrine auto-injectors provided: AuviQ - Indications for epinephrine use discussed. - We will call you in 1-2 weeks with the results of the testing.  3. Return in about 1 month (around 07/27/2017).   Please inform us of any Emergency Department visits, hospitalizations, or changes in symptoms. Call us before going to the ED for breathing or allergy symptoms since we might be able to fit you in for a sick visit. Feel free to contact us anytime with any questions, problems, or concerns.  It was a pleasure to meet you and your family today!  Websites that have reliable patient information: 1. American Academy of Asthma, Allergy, and Immunology: www.aaaai.org 2. Food Allergy Research and Education (FARE): foodallergy.org 3. Mothers of Asthmatics: http://www.asthmacommunitynetwork.org 4. American College of Allergy, Asthma, and Immunology: www.acaai.org

## 2017-06-29 NOTE — Progress Notes (Signed)
NEW PATIENT  Date of Service/Encounter:  06/29/17  Referring provider: Denny Levy, PA   Assessment:   Cough - possible GERD  Non-allergic rhinitis  Anaphylactic shock due to food (wheat, cow's milk) - with negative testing today  Plan/Recommendations:   1. Cough - Lung testing looked great today. - We will address postnasal drip before trying to treat any asthma.  - We will start omeprazole 20mg  once daily to see if this might help your cough.  - Reflux is a known cause of cough, and with your chest pain this is something to consider.   2. Chronic rhinitis - Testing was negative to prick and intradermals. - We will get environmental allergy testing via blood work to confirm this, but the negative prick and intradermal testing definitely point away from an allergic etiology. - Her rhinitis symptoms at least might be related to her cigarette smoking, which does damage cilia and can lead to mucous build up.  - Start Nasacort two sprays per nostril daily. - Start Bepreve one drop per eye twice daily. - Start Eucrisa twice daily as needed to the eye lid and around your eye (safe to use on face)  3. Anaphylactic shock due to food - Testing was negative to Soy, Wheat, Milk and Casein - Avoid the above foods for now.  - We will get blood work to confirm these findings.  - Training for epinephrine auto-injectors provided: AuviQ - Indications for epinephrine use discussed. - We will call you in 1-2 weeks with the results of the testing.  3. Return in about 1 month (around 07/27/2017).  Subjective:   JORDAIN RADIN is a 37 y.o. female presenting today for evaluation of  Chief Complaint  Patient presents with  . Cough    since Winter 2018  . Facial Swelling  . Headache  . Rash    STEPHANE JUNKINS has a history of the following: Patient Active Problem List   Diagnosis Date Noted  . Non-allergic rhinitis 06/29/2017  . Abdominal pain, epigastric 08/04/2014  .  Lower abdominal pain 08/04/2014  . IDA (iron deficiency anemia) 08/04/2014  . Renal insufficiency complicating pregnancy 16/96/7893  . Anemia, unspecified 07/17/2012  . GERD 05/05/2009  . HEMATOCHEZIA 05/05/2009  . WEIGHT LOSS, ABNORMAL 05/05/2009  . DIARRHEA, CHRONIC 05/05/2009    History obtained from: chart review and patient.  Ardine Bjork was referred by Denny Levy, PA.     Addilyne is a 37 y.o. female presenting for eye swelling and a cough. Symptoms started in September and have continued since that time. She has treated with Benadryl which did help somewhat. Her eye swelling is uncomfortable and starting to mess up her vision. Eyes are itching. She has not been on steroids for this. She was using an OTC eye drops (artifical tears) that did not help. This was not a problem prior to September 2018.  Charl does report a cough that has worsened. In the mornings, she reports a lot of phelgm. Typically she cannot talk without finishing a sentence at all. This started in September as well. She felt that it was an illness at first but symptoms continued. She was not treated with an inhaler. Symptoms are worse in the morning and then throughout the day she will have problems with intermittently worsening symptoms. She does have a long 20 year history of smoking and reports that she is trying to quit.   Chrysta denies heart burn and she has never been on a reflux  medication. Symptoms do not change with meals at all. She does report chest pain around twice per week. She did take a nitroglycerin once (this was her husband's prescription); this did help her symptoms. She only did this once. She has never been to the ED for this at all. Episodes do not last too long (around 5-10 minutes). She did have a CXR performed two weeks ago and everything was normal per the patient. We do not have access to that CXR in Epic.   Arraya does report a history of throat swelling to cow's milk  containing products as well as wheat. She does not have an EpiPen in place at this time. She treats this with time only, and it resolves over the course of 30-60 minutes.   Otherwise, there is no history of other atopic diseases, including drug allergies, stinging insect allergies, or urticaria. There is no significant infectious history. Vaccinations are up to date.    Past Medical History: Patient Active Problem List   Diagnosis Date Noted  . Non-allergic rhinitis 06/29/2017  . Abdominal pain, epigastric 08/04/2014  . Lower abdominal pain 08/04/2014  . IDA (iron deficiency anemia) 08/04/2014  . Renal insufficiency complicating pregnancy 17/61/6073  . Anemia, unspecified 07/17/2012  . GERD 05/05/2009  . HEMATOCHEZIA 05/05/2009  . WEIGHT LOSS, ABNORMAL 05/05/2009  . DIARRHEA, CHRONIC 05/05/2009    Medication List:  Allergies as of 06/29/2017   No Known Allergies     Medication List        Accurate as of 06/29/17  6:07 PM. Always use your most recent med list.          Bepotastine Besilate 1.5 % Soln Commonly known as:  BEPREVE Place 1 drop into both eyes 2 (two) times daily.   Crisaborole 2 % Oint Commonly known as:  EUCRISA Apply 1 application topically 2 (two) times daily.   EPINEPHrine 0.3 mg/0.3 mL Soaj injection Commonly known as:  AUVI-Q Use for severe allergic reaction   MIRENA (52 MG) 20 MCG/24HR IUD Generic drug:  levonorgestrel by Intrauterine route.   omeprazole 20 MG capsule Commonly known as:  PRILOSEC Take one tablet daily   valACYclovir 500 MG tablet Commonly known as:  VALTREX       Birth History: non-contributory.   Developmental History: non-contributory.   Past Surgical History: Past Surgical History:  Procedure Laterality Date  . ANKLE SURGERY    . COLONOSCOPY  05/2009   Dr. Oneida Alar: normal TI 10cm, moderate internal hemorrhoids, random colon bx ?early lymphocytic colitis.  Marland Kitchen HEMORRHOID SURGERY    . RHINOPLASTY       Family  History: Family History  Problem Relation Age of Onset  . Other Father        bowel issues, details unavailable, deceased  . Allergic rhinitis Father   . Allergic rhinitis Mother   . Colon cancer Neg Hx   . Eczema Neg Hx   . Asthma Neg Hx   . Angioedema Neg Hx   . Urticaria Neg Hx      Social History: Acquanetta lives at home with her family. She lives in a house that was built in the 1950s. There is carpeting throughout the home. Per the note included with her referral, there is a problem with mold in the home. They have electric heating and central cooling. There is a dog inside of the home. There are no dust mite coverings on the bedding. There is no tobacco exposure in the home. She does smoke one  pack per day for 20 years.     Review of Systems: a 14-point review of systems is pertinent for what is mentioned in HPI.  Otherwise, all other systems were negative. Constitutional: negative other than that listed in the HPI Eyes: negative other than that listed in the HPI Ears, nose, mouth, throat, and face: negative other than that listed in the HPI Respiratory: negative other than that listed in the HPI Cardiovascular: negative other than that listed in the HPI Gastrointestinal: negative other than that listed in the HPI Genitourinary: negative other than that listed in the HPI Integument: negative other than that listed in the HPI Hematologic: negative other than that listed in the HPI Musculoskeletal: negative other than that listed in the HPI Neurological: negative other than that listed in the HPI Allergy/Immunologic: negative other than that listed in the HPI    Objective:   Blood pressure 120/66, pulse (!) 108, temperature 98.5 F (36.9 C), temperature source Oral, resp. rate 16, height 5\' 3"  (1.6 m), weight 125 lb 9.6 oz (57 kg), unknown if currently breastfeeding. Body mass index is 22.25 kg/m.   Physical Exam:  General: Alert, interactive, in no acute distress.  Pleasant.  Eyes: Periorbital edema bilaterally with eczematous dry skin and excoriations noted, No conjunctival injection bilaterally, no discharge on the right, no discharge on the left, no Horner-Trantas dots present and allergic shiners present bilaterally. PERRL bilaterally. EOMI without pain. No photophobia.  Ears: Right TM pearly gray with normal light reflex, Left TM pearly gray with normal light reflex, Right TM intact without perforation and Left TM intact without perforation.  Nose/Throat: External nose within normal limits, nasal crease present and septum midline. Turbinates markedly edematous and pale with clear discharge. Posterior oropharynx erythematous with cobblestoning in the posterior oropharynx. Tonsils 3+ without exudates.  Tongue without thrush. Neck: Supple without thyromegaly. Trachea midline. Adenopathy: no enlarged lymph nodes appreciated in the anterior cervical, occipital, axillary, epitrochlear, inguinal, or popliteal regions. Lungs: Clear to auscultation without wheezing, rhonchi or rales. No increased work of breathing. CV: Normal S1/S2. No murmurs. Capillary refill <2 seconds.  Abdomen: Nondistended, nontender. No guarding or rebound tenderness. Bowel sounds present in all fields and hyperactive  Skin: Warm and dry, without lesions or rashes. Extremities:  No clubbing, cyanosis or edema. Neuro:   Grossly intact. No focal deficits appreciated. Responsive to questions.  Diagnostic studies:   Spirometry: results normal (FEV1: 3.01/100%, FVC: 3.65/101%, FEV1/FVC: 82%).    Spirometry consistent with normal pattern.   Allergy Studies:   Indoor/Outdoor Percutaneous Adult Environmental Panel: negative to the entire panel with adequate controls.  Indoor/Outdoor Percutaneous Pediatric Environmental Panel: negative to the entire panel with adequate controls.  Selected Foods: negative to cow's milk, soy, casein, and wheat with adequate controls   Allergy testing  results were read and interpreted by myself, documented by clinical staff.     Salvatore Marvel, MD Allergy and [place] [facility] [place]

## 2017-07-04 DIAGNOSIS — J301 Allergic rhinitis due to pollen: Secondary | ICD-10-CM | POA: Diagnosis not present

## 2017-07-04 DIAGNOSIS — Z720 Tobacco use: Secondary | ICD-10-CM | POA: Diagnosis not present

## 2017-07-04 DIAGNOSIS — J019 Acute sinusitis, unspecified: Secondary | ICD-10-CM | POA: Diagnosis not present

## 2017-07-04 DIAGNOSIS — R05 Cough: Secondary | ICD-10-CM | POA: Diagnosis not present

## 2017-07-06 ENCOUNTER — Telehealth: Payer: Self-pay

## 2017-07-06 LAB — IGE+ALLERGENS ZONE 2(30)
Alternaria Alternata IgE: <0.1 kU/L
Bahia Grass IgE: <0.1 kU/L
Cat Dander IgE: <0.1 kU/L
Cedar, Mountain IgE: <0.1 kU/L
Cockroach, American IgE: <0.1 kU/L
Common Silver Birch IgE: <0.1 kU/L
D Pteronyssinus IgE: <0.1 kU/L
Elm, American IgE: <0.1 kU/L
IgE (Immunoglobulin E), Serum: 5 IU/mL — ABNORMAL LOW (ref 6–495)
Johnson Grass IgE: <0.1 kU/L
Mugwort IgE Qn: <0.1 kU/L
Oak, White IgE: <0.1 kU/L
Pigweed, Rough IgE: <0.1 kU/L
Plantain, English IgE: <0.1 kU/L
Ragweed, Short IgE: <0.1 kU/L
Stemphylium Herbarum IgE: <0.1 kU/L
Timothy Grass IgE: <0.1 kU/L

## 2017-07-06 LAB — MILK COMPONENT PANEL
F076-IgE Alpha Lactalbumin: <0.1 kU/L
F077-IgE Beta Lactoglobulin: <0.1 kU/L

## 2017-07-06 LAB — ALLERGEN, WHEAT, F4

## 2017-07-06 MED ORDER — PIMECROLIMUS 1 % EX CREA: TOPICAL_CREAM | Freq: Two times a day (BID) | CUTANEOUS | 5 refills | Status: DC | PRN

## 2017-07-06 NOTE — Telephone Encounter (Signed)
Elidel PRN BID is fine with me.  Salvatore Marvel, MD Allergy and [place] [facility] [place]

## 2017-07-06 NOTE — Addendum Note (Signed)
Addended by: Lucrezia Starch I on: 07/06/2017 05:49 PM   Modules accepted: Orders

## 2017-07-06 NOTE — Telephone Encounter (Signed)
Patient's insurance will not cover [place]. Please advise on possible alternatives. ?Elidel?

## 2017-07-06 NOTE — Telephone Encounter (Signed)
Prescription has been sent in. Patient is aware. 

## 2017-07-18 DIAGNOSIS — L719 Rosacea, unspecified: Secondary | ICD-10-CM | POA: Diagnosis not present

## 2017-07-18 DIAGNOSIS — L309 Dermatitis, unspecified: Secondary | ICD-10-CM | POA: Diagnosis not present

## 2017-07-18 DIAGNOSIS — D485 Neoplasm of uncertain behavior of skin: Secondary | ICD-10-CM | POA: Diagnosis not present

## 2017-07-19 ENCOUNTER — Encounter: Payer: Self-pay | Admitting: *Deleted

## 2017-07-20 DIAGNOSIS — J329 Chronic sinusitis, unspecified: Secondary | ICD-10-CM | POA: Diagnosis not present

## 2017-07-20 DIAGNOSIS — Z6821 Body mass index (BMI) 21.0-21.9, adult: Secondary | ICD-10-CM | POA: Diagnosis not present

## 2017-07-20 DIAGNOSIS — Z720 Tobacco use: Secondary | ICD-10-CM | POA: Diagnosis not present

## 2017-07-20 DIAGNOSIS — R05 Cough: Secondary | ICD-10-CM | POA: Diagnosis not present

## 2017-07-26 DIAGNOSIS — J320 Chronic maxillary sinusitis: Secondary | ICD-10-CM | POA: Diagnosis not present

## 2017-07-26 DIAGNOSIS — J329 Chronic sinusitis, unspecified: Secondary | ICD-10-CM | POA: Diagnosis not present

## 2017-07-26 DIAGNOSIS — R05 Cough: Secondary | ICD-10-CM | POA: Diagnosis not present

## 2017-07-31 ENCOUNTER — Ambulatory Visit: Payer: BLUE CROSS/BLUE SHIELD | Admitting: Allergy & Immunology

## 2017-08-28 DIAGNOSIS — Z30432 Encounter for removal of intrauterine contraceptive device: Secondary | ICD-10-CM | POA: Diagnosis not present

## 2018-03-20 DIAGNOSIS — H6692 Otitis media, unspecified, left ear: Secondary | ICD-10-CM | POA: Diagnosis not present

## 2018-03-20 DIAGNOSIS — J0101 Acute recurrent maxillary sinusitis: Secondary | ICD-10-CM | POA: Diagnosis not present

## 2018-03-20 DIAGNOSIS — H109 Unspecified conjunctivitis: Secondary | ICD-10-CM | POA: Diagnosis not present

## 2018-03-20 DIAGNOSIS — Z6822 Body mass index (BMI) 22.0-22.9, adult: Secondary | ICD-10-CM | POA: Diagnosis not present

## 2018-07-09 NOTE — Progress Notes (Signed)
REVIEWED-NO ADDITIONAL RECOMMENDATIONS. 

## 2019-12-30 ENCOUNTER — Ambulatory Visit (INDEPENDENT_AMBULATORY_CARE_PROVIDER_SITE_OTHER): Payer: Self-pay | Admitting: Dermatology

## 2019-12-30 ENCOUNTER — Other Ambulatory Visit: Payer: Self-pay

## 2019-12-30 ENCOUNTER — Encounter: Payer: Self-pay | Admitting: Dermatology

## 2019-12-30 DIAGNOSIS — Z86018 Personal history of other benign neoplasm: Secondary | ICD-10-CM

## 2019-12-30 DIAGNOSIS — Z87898 Personal history of other specified conditions: Secondary | ICD-10-CM

## 2019-12-30 DIAGNOSIS — Z129 Encounter for screening for malignant neoplasm, site unspecified: Secondary | ICD-10-CM

## 2019-12-30 DIAGNOSIS — Z1283 Encounter for screening for malignant neoplasm of skin: Secondary | ICD-10-CM

## 2019-12-30 NOTE — Progress Notes (Signed)
Left under arm mole Legs clear Left foot lentigo x3

## 2019-12-30 NOTE — Patient Instructions (Signed)
First follow-up in many years for Beverly Brown date of birth 03-10-1981.  She has a history of atypical moles.  Examination today from feet to scalp showed no new or recurrent atypical moles, melanoma, or nonmobile skin cancer.  And on the rim of the left ear and the left outer chest wall are 3 mm tan textured papules which showed typical benign keratoses on dermoscopy.  On the dorsal left third toe is a 68mm blisterlike bubble which is likely a pseudomyxoid cyst and does not currently require removal.  New freckles on the top of the left foot with dermoscopy support benign acral lentigos.  No atypical moles.  Routine follow-up in 2 years but should self examined twice annually with spouse.

## 2019-12-30 NOTE — Progress Notes (Signed)
   New Patient   Subjective  Beverly Brown is a 39 y.o. female who presents for the following: New Patient (Initial Visit) (Patient here today for skin check.  Per patient check spot on left ear rim x 1 year no bleeding per patient the spot is raising.  Check spot left outer breast x years per patient getting larger no bleeding.).  Moles Location:  Duration:  Quality:  Associated Signs/Symptoms: Modifying Factors:  Severity:  Timing: Context:    The following portions of the chart were reviewed this encounter and updated as appropriate: Tobacco  Allergies  Meds  Problems  Med Hx  Surg Hx  Fam Hx      Objective  Well appearing patient in no apparent distress; mood and affect are within normal limits.  A full examination was performed including scalp, head, eyes, ears, nose, lips, neck, chest, axillae, abdomen, back, buttocks, bilateral upper extremities, bilateral lower extremities, hands, feet, fingers, toes, fingernails, and toenails. All findings within normal limits unless otherwise noted below.   Assessment & Plan  History of atypical nevus Mid Back  Screening for malignant neoplasm Mid Back  No atypical moles or non mole skin cancers.

## 2020-01-29 ENCOUNTER — Ambulatory Visit: Payer: BLUE CROSS/BLUE SHIELD | Admitting: Dermatology

## 2020-02-25 ENCOUNTER — Telehealth: Payer: Self-pay | Admitting: Dermatology

## 2020-02-25 NOTE — Telephone Encounter (Signed)
Angry about a bill for 12/30/19 visit. Says she keeps getting bills but paid in full before she left. Said she was told she would get a discount. (As coded, what she paid was the discounted 55% rate for a new patient without insurance.)Says she contacted billing and they tell her it's [place]. I'll contact billing.

## 2020-02-26 ENCOUNTER — Telehealth: Payer: Self-pay | Admitting: Dermatology

## 2020-02-26 NOTE — Telephone Encounter (Signed)
Told patient I just spoke to [facility], and they said it the additional amount was a mistake and they would send it out for correction + it would take about 2 weeks. (She was relieved.)

## 2020-12-30 ENCOUNTER — Ambulatory Visit: Payer: Medicaid Other | Admitting: Dermatology

## 2022-04-08 ENCOUNTER — Other Ambulatory Visit (HOSPITAL_BASED_OUTPATIENT_CLINIC_OR_DEPARTMENT_OTHER): Payer: Self-pay

## 2022-04-08 DIAGNOSIS — R0989 Other specified symptoms and signs involving the circulatory and respiratory systems: Secondary | ICD-10-CM

## 2022-04-08 DIAGNOSIS — R0689 Other abnormalities of breathing: Secondary | ICD-10-CM

## 2022-04-08 DIAGNOSIS — R0602 Shortness of breath: Secondary | ICD-10-CM
